# Patient Record
Sex: Male | Born: 1985 | Race: Black or African American | Hispanic: No | Marital: Single | Smoking: Never smoker
Health system: Southern US, Community
[De-identification: ages and names within clinical notes are randomized; demographics above are authoritative.]

## PROBLEM LIST (undated history)

## (undated) HISTORY — PX: HAND SURGERY: SHX662

---
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
## 2007-03-18 ENCOUNTER — Emergency Department (HOSPITAL_COMMUNITY): Admission: EM | Admit: 2007-03-18 | Discharge: 2007-03-18 | Payer: Self-pay | Admitting: *Deleted

## 2010-05-05 ENCOUNTER — Emergency Department (HOSPITAL_COMMUNITY): Admission: EM | Admit: 2010-05-05 | Discharge: 2010-05-05 | Payer: Self-pay | Admitting: Emergency Medicine

## 2010-11-28 ENCOUNTER — Inpatient Hospital Stay (HOSPITAL_COMMUNITY)
Admission: RE | Admit: 2010-11-28 | Discharge: 2010-11-28 | Disposition: A | Discharge status: Home / Self Care | Payer: Self-pay | Source: Ambulatory Visit | Attending: Emergency Medicine | Admitting: Emergency Medicine

## 2011-02-05 ENCOUNTER — Emergency Department (HOSPITAL_COMMUNITY): Payer: Self-pay

## 2011-02-05 ENCOUNTER — Emergency Department (HOSPITAL_COMMUNITY)
Admission: EM | Admit: 2011-02-05 | Discharge: 2011-02-05 | Disposition: A | Discharge status: Home / Self Care | Payer: Self-pay | Attending: Emergency Medicine | Admitting: Emergency Medicine

## 2011-02-05 DIAGNOSIS — W268XXA Contact with other sharp object(s), not elsewhere classified, initial encounter: Secondary | ICD-10-CM | POA: Insufficient documentation

## 2011-02-05 DIAGNOSIS — S61209A Unspecified open wound of unspecified finger without damage to nail, initial encounter: Secondary | ICD-10-CM | POA: Insufficient documentation

## 2013-01-10 ENCOUNTER — Emergency Department: Payer: Self-pay | Admitting: Emergency Medicine

## 2013-01-10 LAB — BASIC METABOLIC PANEL
Anion Gap: 6 — ABNORMAL LOW (ref 7–16)
BUN: 9 mg/dL (ref 7–18)
Calcium, Total: 9 mg/dL (ref 8.5–10.1)
Chloride: 106 mmol/L (ref 98–107)
Co2: 28 mmol/L (ref 21–32)
Creatinine: 1.15 mg/dL (ref 0.60–1.30)
EGFR (African American): >60
EGFR (Non-African Amer.): >60
Glucose: 142 mg/dL — ABNORMAL HIGH (ref 65–99)
Osmolality: 281 (ref 275–301)
Potassium: 3.8 mmol/L (ref 3.5–5.1)
Sodium: 140 mmol/L (ref 136–145)

## 2013-01-10 LAB — URINALYSIS, COMPLETE
Bacteria: NONE SEEN
Bilirubin,UR: NEGATIVE
Blood: NEGATIVE
Glucose,UR: 50 mg/dL (ref 0–75)
Ketone: NEGATIVE
Leukocyte Esterase: NEGATIVE
Nitrite: NEGATIVE
Ph: 6 (ref 4.5–8.0)
Protein: 30
RBC,UR: 1 /HPF (ref 0–5)
Specific Gravity: 1.027 (ref 1.003–1.030)
Squamous Epithelial: <1
WBC UR: 4 /HPF (ref 0–5)

## 2013-01-10 LAB — CBC
HCT: 39.8 % — ABNORMAL LOW (ref 40.0–52.0)
HGB: 13.4 g/dL (ref 13.0–18.0)
MCH: 28.1 pg (ref 26.0–34.0)
MCHC: 33.7 g/dL (ref 32.0–36.0)
MCV: 83 fL (ref 80–100)
Platelet: 170 10*3/uL (ref 150–440)
RBC: 4.78 10*6/uL (ref 4.40–5.90)
RDW: 13.3 % (ref 11.5–14.5)
WBC: 5.8 10*3/uL (ref 3.8–10.6)

## 2021-06-18 ENCOUNTER — Encounter (HOSPITAL_BASED_OUTPATIENT_CLINIC_OR_DEPARTMENT_OTHER): Payer: Self-pay

## 2021-06-18 ENCOUNTER — Other Ambulatory Visit: Payer: Self-pay

## 2021-06-18 ENCOUNTER — Emergency Department (HOSPITAL_BASED_OUTPATIENT_CLINIC_OR_DEPARTMENT_OTHER)
Admission: EM | Admit: 2021-06-18 | Discharge: 2021-06-18 | Disposition: A | Discharge status: Home / Self Care | Payer: Self-pay | Attending: Emergency Medicine | Admitting: Emergency Medicine

## 2021-06-18 ENCOUNTER — Emergency Department (HOSPITAL_BASED_OUTPATIENT_CLINIC_OR_DEPARTMENT_OTHER): Payer: Self-pay

## 2021-06-18 DIAGNOSIS — M542 Cervicalgia: Secondary | ICD-10-CM | POA: Insufficient documentation

## 2021-06-18 DIAGNOSIS — G8929 Other chronic pain: Secondary | ICD-10-CM | POA: Insufficient documentation

## 2021-06-18 DIAGNOSIS — M25512 Pain in left shoulder: Secondary | ICD-10-CM | POA: Insufficient documentation

## 2021-06-18 MED ORDER — ACETAMINOPHEN 325 MG PO TABS
650.0000 mg | ORAL_TABLET | Freq: Once | ORAL | Status: AC
  Administered 2021-06-18: 650 mg via ORAL
  Filled 2021-06-18: qty 2

## 2021-06-18 MED ORDER — IBUPROFEN 800 MG PO TABS
800.0000 mg | ORAL_TABLET | Freq: Once | ORAL | Status: AC
  Administered 2021-06-18: 800 mg via ORAL
  Filled 2021-06-18: qty 1

## 2021-06-18 NOTE — ED Triage Notes (Addendum)
Pt c/o pain to left shoulder and left side of neck-states intermittent pain since injury in 2010-started having pain yesterday during activity at work-pain worse with movement-NAD-steady gait

## 2021-06-18 NOTE — Discharge Instructions (Addendum)
Please use Tylenol or ibuprofen for pain.  You may use 600 mg ibuprofen every 6 hours or 1000 mg of Tylenol every 6 hours.  You may choose to alternate between the 2.  This would be most effective.  Not to exceed 4 g of Tylenol within 24 hours.  Not to exceed 3200 mg ibuprofen 24 hours.  You can use the muscle relaxant in addition for breakthrough pain. You can also use the voltaren gel up to 4x a day directly to the affected area.  Follow-up with orthopedics if your pain continues despite treatment.  In addition to the above regimen I recommend rest, ice of the affected limb.

## 2021-06-18 NOTE — ED Provider Notes (Signed)
[facility] EMERGENCY DEPARTMENT Provider Note   CSN: [id number] Arrival date & time: 06/18/21  1117     History Chief Complaint  Patient presents with   Shoulder Injury    Eric Yang is a 35 y.o. male patient reports acute on chronic pain to the left shoulder and left side of neck.  Patient reports that he has had intermittent pain since an injury in 2010 that he sustained while lifting weights, doing push-ups where he heard a pop in his left shoulder.  Patient did not officially dislocate the shoulder, did not have to have anyone put it back in.  Patient has been taking ibuprofen and Tylenol for the pain which he reports does help control the pain, however pain is aggravated with motion, his job requires him to do a lot of shoulder intensive movements with the left arm.  Patient reports the pain is shooting, radiating from the left neck down into the left shoulder, but without any numbness or tingling, or weakness.   Shoulder Injury      History reviewed. No pertinent past medical history.  There are no problems to display for this patient.   Past Surgical History:  Procedure Laterality Date   HAND SURGERY         No family history on file.  Social History   Tobacco Use   Smoking status: Never   Smokeless tobacco: Never  Vaping Use   Vaping Use: Never used  Substance Use Topics   Alcohol use: Never   Drug use: Never    Home Medications Prior to Admission medications   Not on File    Allergies    Patient has no known allergies.  Review of Systems   Review of Systems  Musculoskeletal:  Positive for arthralgias.  All other systems reviewed and are negative.  Physical Exam Updated Vital Signs BP (!) 142/65 (BP Location: Right Arm)   Pulse 75   Temp 98.8 F (37.1 C) (Oral)   Resp 18   Ht 5\' 8"  (1.727 m)   Wt 83.9 kg   SpO2 100%   BMI 28.13 kg/m   Physical Exam Vitals and nursing note reviewed.  Constitutional:      General: He is  not in acute distress.    Appearance: Normal appearance.  HENT:     Head: Normocephalic and atraumatic.  Eyes:     General:        Right eye: No discharge.        Left eye: No discharge.  Cardiovascular:     Rate and Rhythm: Normal rate and regular rhythm.     Pulses: Normal pulses.  Pulmonary:     Effort: Pulmonary effort is normal. No respiratory distress.  Musculoskeletal:        General: No deformity.     Comments: Some pain with crossarm abduction, internal rotation, flexion, and extension.  Tenderness to palpation over the humeral head, SCM muscles, no tenderness to palpation in the midline cervical spine, no tenderness to palpation of the clavicle.  No step-offs or deformity noted.  Strength is 5 out of 5 for flexion, extension, abduction, abduction, internal, external rotation with some pain.  Skin:    General: Skin is warm and dry.     Capillary Refill: Capillary refill takes less than 2 seconds.  Neurological:     Mental Status: He is alert and oriented to person, place, and time.     Sensory: No sensory deficit.  Psychiatric:  Mood and Affect: Mood normal.        Behavior: Behavior normal.    ED Results / Procedures / Treatments   Labs (all labs ordered are listed, but only abnormal results are displayed) Labs Reviewed - No data to display  EKG None  Radiology DG Shoulder Left  Result Date: 06/18/2021 CLINICAL DATA:  Acute on chronic pain. EXAM: LEFT SHOULDER - 2+ VIEW COMPARISON:  None. FINDINGS: There is no evidence of fracture or dislocation. There is no evidence of arthropathy or other focal bone abnormality. Soft tissues are unremarkable. IMPRESSION: Negative. Electronically Signed   By: Signa Kell M.D.   On: 06/18/2021 13:56    Procedures Procedures   Medications Ordered in ED Medications  ibuprofen (ADVIL) tablet 800 mg (has no administration in time range)  acetaminophen (TYLENOL) tablet 650 mg (has no administration in time range)    ED  Course  I have reviewed the triage vital signs and the nursing notes.  Pertinent labs & imaging results that were available during my care of the patient were reviewed by me and considered in my medical decision making (see chart for details).    MDM Rules/Calculators/A&P                         Acute on chronic left shoulder pain with history of injury to affected joint.  Favor impingement versus rotator cuff inflammation.  No evidence of acute tear, dislocation, no evidence of acute fracture.  Will obtain radiographic imaging of the left shoulder to assess for arthritic changes, impingement. No abnormality seen on radiographic imaging. Discussed pain control with ibuprofen, Tylenol, muscle relaxant, Voltaren gel, encouraged follow-up with orthopedics.  Left arm is entirely neurovascularly intact.  Discussed return precautions.  Patient discharged in stable condition.  Patient understands and agrees to plan.  Final Clinical Impression(s) / ED Diagnoses Final diagnoses:  Acute pain of left shoulder    Rx / DC Orders ED Discharge Orders     None        Olene Floss, PA-C 06/18/21 1409    Milagros Loll, MD 06/19/21 2042

## 2023-05-12 IMAGING — DX DG SHOULDER 2+V*L*
3 series · 3 of 3 positions shown · non-contrast
Comparison: None.

CLINICAL DATA: Acute on chronic pain.

EXAM:
LEFT SHOULDER - 2+ VIEW

[shoulder grashey]
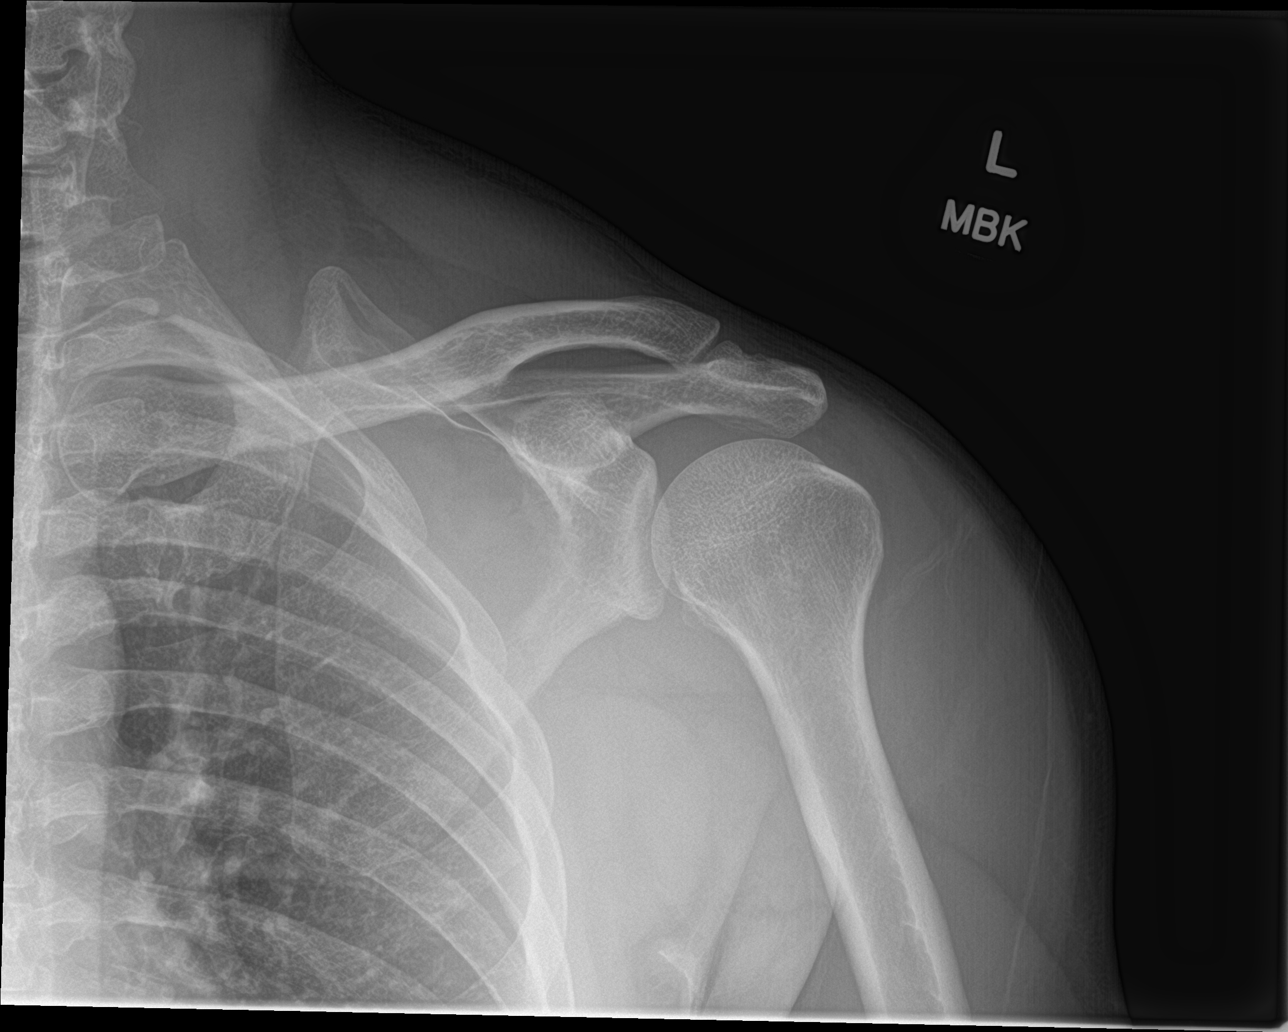

[shoulder y view]
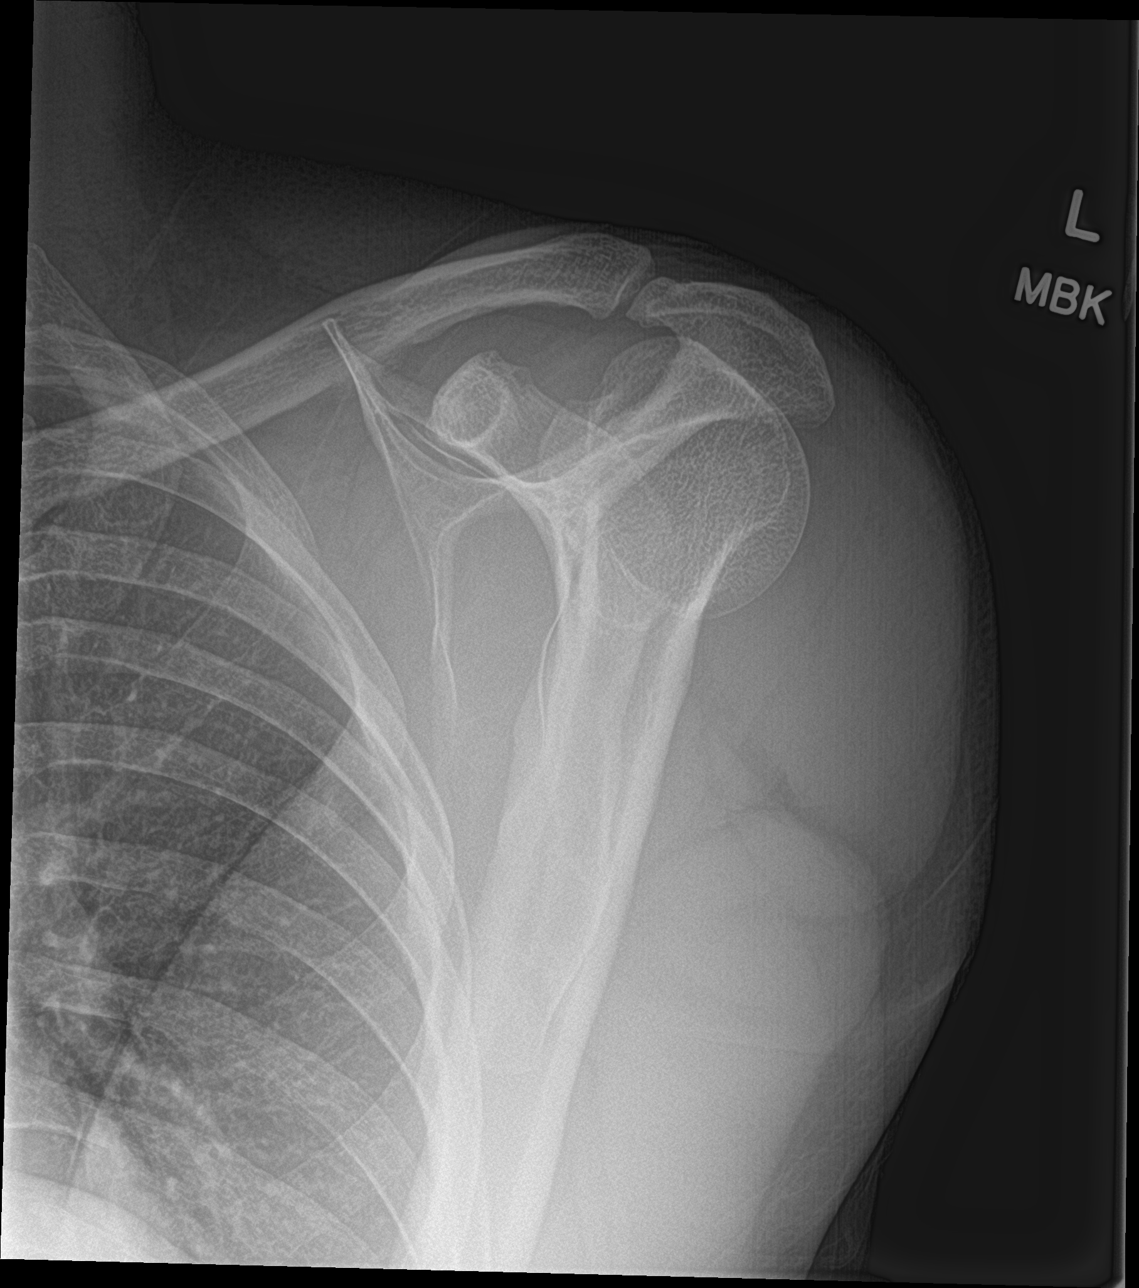

[shoulder axillary]
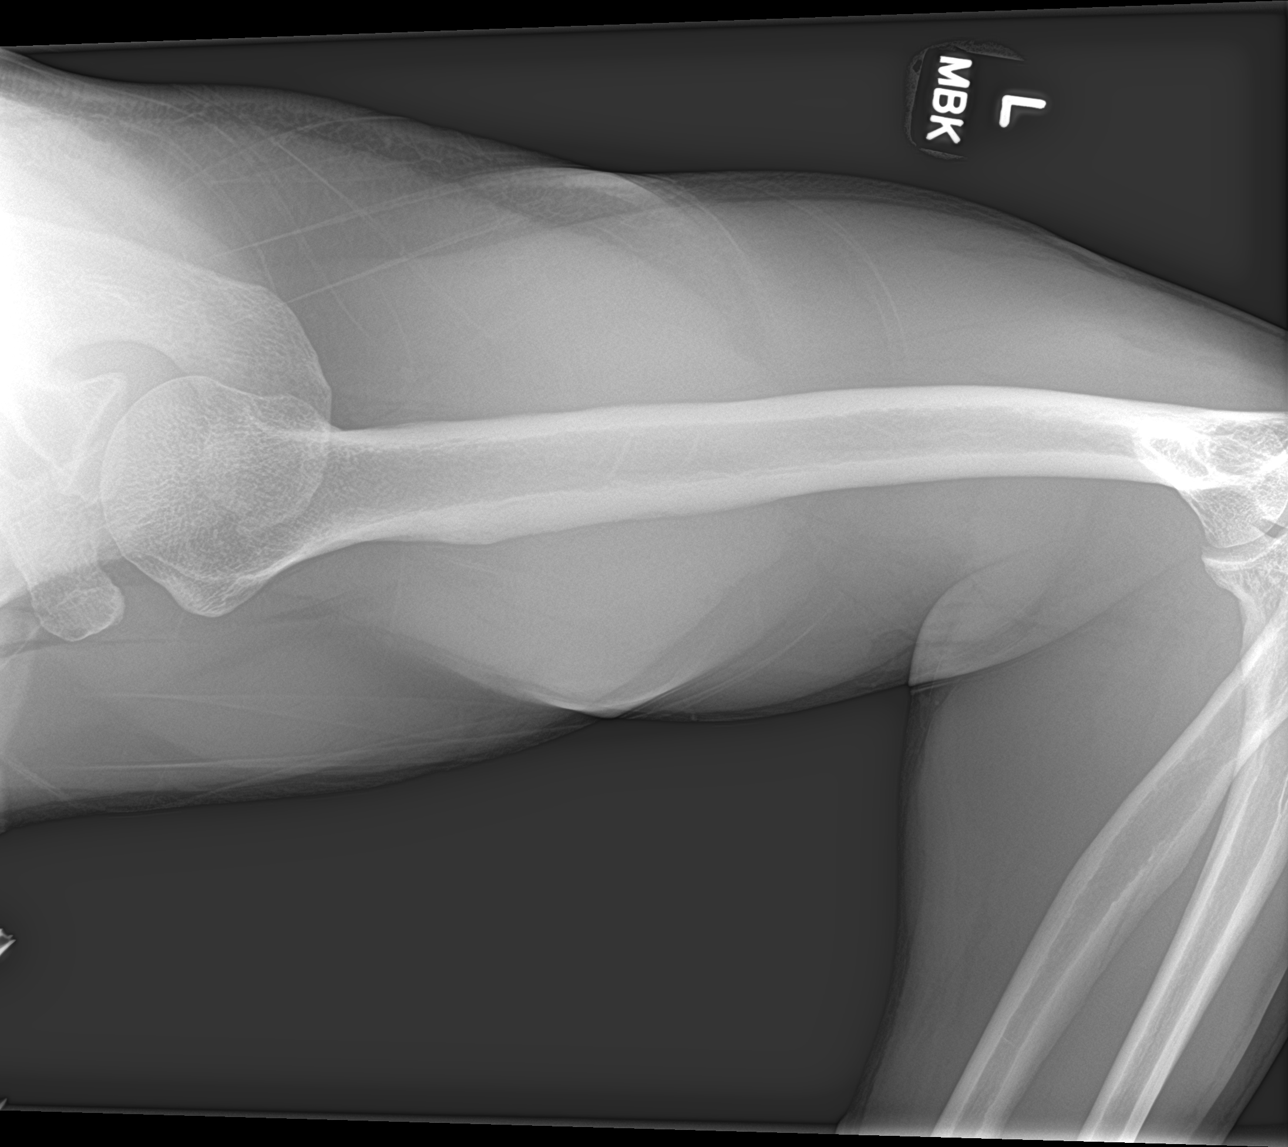

[3 of 3 positions shown; findings below may reference images not displayed]

FINDINGS: There is no evidence of fracture or dislocation. There is no
evidence of arthropathy or other focal bone abnormality. Soft
tissues are unremarkable.
IMPRESSION: Negative.
